# Patient Record
Sex: Female | Born: 1955
Health system: Southern US, Community
[De-identification: ages and names within clinical notes are randomized; demographics above are authoritative.]

## PROBLEM LIST (undated history)

## (undated) DIAGNOSIS — C801 Malignant (primary) neoplasm, unspecified: Secondary | ICD-10-CM

## (undated) HISTORY — PX: BREAST LUMPECTOMY: SHX2

## (undated) HISTORY — PX: CYSTOSCOPY: SUR368

---

## 2020-08-06 ENCOUNTER — Emergency Department: Admit: 2020-08-06 | Discharge status: Absent without leave | Payer: Self-pay

## 2020-08-06 ENCOUNTER — Emergency Department (INDEPENDENT_AMBULATORY_CARE_PROVIDER_SITE_OTHER): Payer: BC Managed Care – PPO

## 2020-08-06 ENCOUNTER — Emergency Department (INDEPENDENT_AMBULATORY_CARE_PROVIDER_SITE_OTHER)
Admission: EM | Admit: 2020-08-06 | Discharge: 2020-08-06 | Disposition: A | Discharge status: Home / Self Care | Payer: BC Managed Care – PPO | Source: Home / Self Care

## 2020-08-06 ENCOUNTER — Other Ambulatory Visit: Payer: Self-pay

## 2020-08-06 DIAGNOSIS — R0781 Pleurodynia: Secondary | ICD-10-CM | POA: Diagnosis not present

## 2020-08-06 DIAGNOSIS — S299XXA Unspecified injury of thorax, initial encounter: Secondary | ICD-10-CM

## 2020-08-06 DIAGNOSIS — W19XXXA Unspecified fall, initial encounter: Secondary | ICD-10-CM

## 2020-08-06 DIAGNOSIS — S2232XA Fracture of one rib, left side, initial encounter for closed fracture: Secondary | ICD-10-CM | POA: Diagnosis not present

## 2020-08-06 HISTORY — DX: Malignant (primary) neoplasm, unspecified: C80.1

## 2020-08-06 MED ORDER — NAPROXEN 375 MG PO TABS: 375.0000 mg | ORAL_TABLET | Freq: Two times a day (BID) | ORAL | 0 refills | Status: AC

## 2020-08-06 NOTE — Discharge Instructions (Signed)
Take naproxen 375 twice daily as needed for pain related to rib fracture.  Ensure that you are performing deep breathing exercises not taking 10 deep breaths at least 5-6 times daily. If symptoms worsen or do not improve I have provided the information to contact Dr. Darene Lamer who is located next door and her primary care office he is our sports medicine provider for the urgent care and can assist if any of your symptoms worsen.

## 2020-08-06 NOTE — ED Provider Notes (Signed)
[facility]    CSN: [id number] Arrival date & time: 08/06/20  1657      History   Chief Complaint Chief Complaint  Patient presents with  . Chest Pain    L ribs    HPI Carolyn Oliver is a 65 y.o. female.   HPI  Patient sustained a fall today in which she landed on her left chest wall.  Since fall has occurred she has had pain with deep breathing and endorses some left-sided rib pain. She denies any overt shortness of breath. She is experiencing pain with extending her left upper extremity and with certain twisting movements.. Patient recently discontinued use of Prolia and has concern for fragile bones since discontinuing medication.  She plans to follow-up with her provider to discuss alternative options.  Endorses high doses of supplements of calcium and vitamin D.  Patient also had a lumpectomy and due to breast cancer involving the left breast and is currently taken Femara. Past Medical History:  Diagnosis Date  . Cancer ([place])    L breast and bladder    There are no problems to display for this patient.    OB History   No obstetric history on file.      Home Medications    Prior to Admission medications   Medication Sig Start Date End Date Taking? Authorizing Provider  letrozole (FEMARA) 2.5 MG tablet Take 2.5 mg by mouth daily.   Yes [provider]  Multiple Vitamins-Minerals (MULTIVITAMIN WITH MINERALS) tablet Take 1 tablet by mouth daily.   Yes [provider]    Family History Family History  Problem Relation Age of Onset  . Breast cancer Mother   . Cancer Father     Social History Social History   Tobacco Use  . Smoking status: Former Research scientist (life sciences)  . Smokeless tobacco: Never Used  . Tobacco comment: Quit 30 years ago  Vaping Use  . Vaping Use: Never used  Substance Use Topics  . Alcohol use: Yes    Alcohol/week: 2.0 standard drinks    Types: 2 Glasses of wine per week  . Drug use: Never     Allergies   Patient has  no known allergies.   Review of Systems Review of Systems Pertinent negatives listed in HPI  Physical Exam Triage Vital Signs ED Triage Vitals  Enc Vitals Group     BP 08/06/20 1725 112/79     Pulse Rate 08/06/20 1725 90     Resp 08/06/20 1725 20     Temp 08/06/20 1725 98.7 F (37.1 C)     Temp Source 08/06/20 1725 Oral     SpO2 08/06/20 1725 97 %     Weight 08/06/20 1718 220 lb (99.8 kg)     Height 08/06/20 1718 5\' 7"  (1.702 m)     Head Circumference --      Peak Flow --      Pain Score --      Pain Loc --      Pain Edu? --      Excl. in [place]? --    No data found.  Updated Vital Signs BP 112/79 (BP Location: Left Arm)   Pulse 90   Temp 98.7 F (37.1 C) (Oral)   Resp 20   Ht 5\' 7"  (1.702 m)   Wt 220 lb (99.8 kg)   SpO2 97%   BMI 34.46 kg/m   Visual Acuity Right Eye Distance:   Left Eye Distance:   Bilateral Distance:  Right Eye Near:   Left Eye Near:    Bilateral Near:     Physical Exam General appearance: alert, well nourished, cooperative Head: Normocephalic, without obvious abnormality, atraumatic Respiratory: Respirations even and unlabored, normal respiratory rate Heart: rate and rhythm normal.  Skin: Skin color, texture, turgor normal. No rashes seen  Psych: Appropriate mood and affect. Neurologic: GCS 15, normal gait, symmetrical movements.  UC Treatments / Results  Labs (all labs ordered are listed, but only abnormal results are displayed) Labs Reviewed - No data to display  EKG   Radiology No results found.  Procedures Procedures (including critical care time)  Medications Ordered in UC Medications - No data to display  Initial Impression / Assessment and Plan / UC Course  I have reviewed the triage vital signs and the nursing notes.  Pertinent labs & imaging results that were available during my care of the patient were reviewed by me and considered in my medical decision making (see chart for details).     Patient presents  today for concern of a possible rib fracture after sustaining a fall in which she landed on her left chest wall and left breast.  Patient is a cancer survivor recently discontinued taking Prolia per imaging patient had an apparent eighth rib fracture which correlates to patient's level of pain.  Patient offered pain medication however declined would like to try NSAIDs as pain is only present with movements.  Encouraged her to perform deep breathing exercises to prevent development of a pneumonia.  Advised patient also to follow-up with Dr. Darene Lamer if any of her symptoms worsen or do not improve however to the patient with healing of fractures involving the ribs as these take additional time compared to other types of simple fractures. Final Clinical Impressions(s) / UC Diagnoses   Final diagnoses:  Closed fracture of one rib of left side, initial encounter 8th rib  Fall, initial encounter     Discharge Instructions     Take naproxen 375 twice daily as needed for pain related to rib fracture.  Ensure that you are performing deep breathing exercises not taking 10 deep breaths at least 5-6 times daily. If symptoms worsen or do not improve I have provided the information to contact Dr. Darene Lamer who is located next door and her primary care office he is our sports medicine provider for the urgent care and can assist if any of your symptoms worsen.    ED Prescriptions    Medication Sig Dispense Auth. Provider   naproxen (NAPROSYN) 375 MG tablet Take 1 tablet (375 mg total) by mouth 2 (two) times daily. 20 tablet Scot Jun, FNP     PDMP not reviewed this encounter.   Scot Jun, FNP 08/06/20 [phone]

## 2020-08-06 NOTE — ED Triage Notes (Signed)
Pt presents to Urgent Care with w/ L rib and breast pain following a fall today. She reports that her foot sank into a hole in her yard this afternoon which caused her to fall and land on her L side. Denies sob, but states it hurts to take a deep breath.

## 2021-11-21 IMAGING — DX DG RIBS W/ CHEST 3+V*L*
3 series · 3 of 3 positions shown · non-contrast
Comparison: None.

CLINICAL DATA: Fell with trauma to the left chest.

EXAM:
LEFT RIBS AND CHEST - 3+ VIEW

[chest pa]
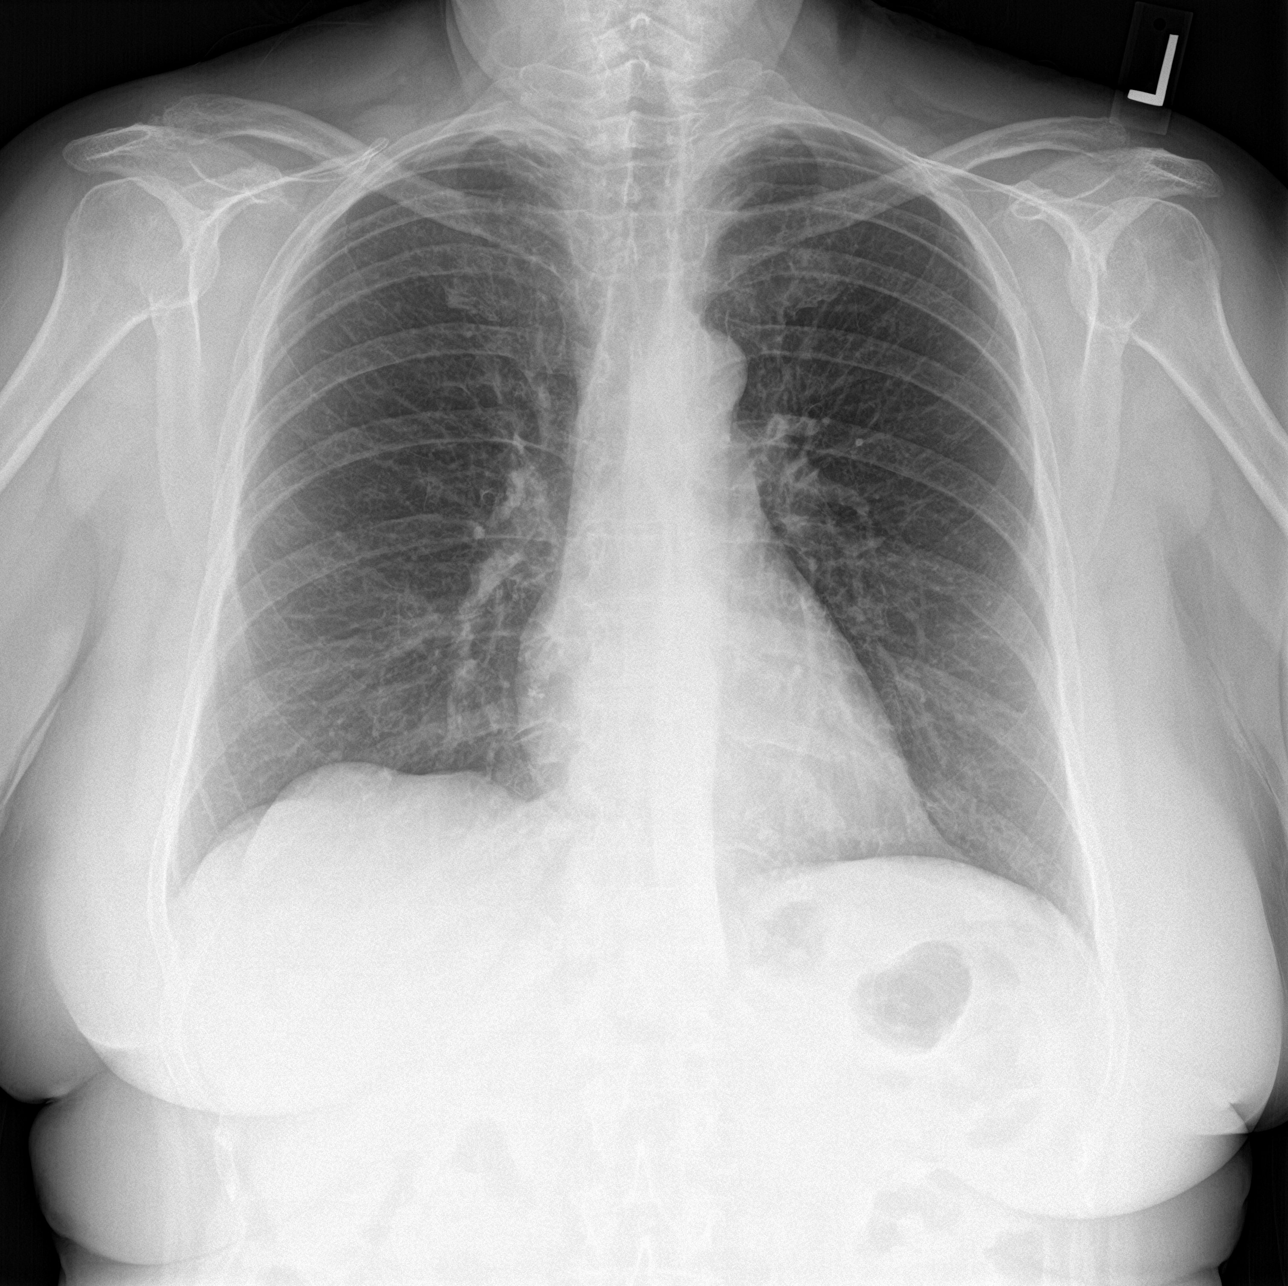

[rib pa]
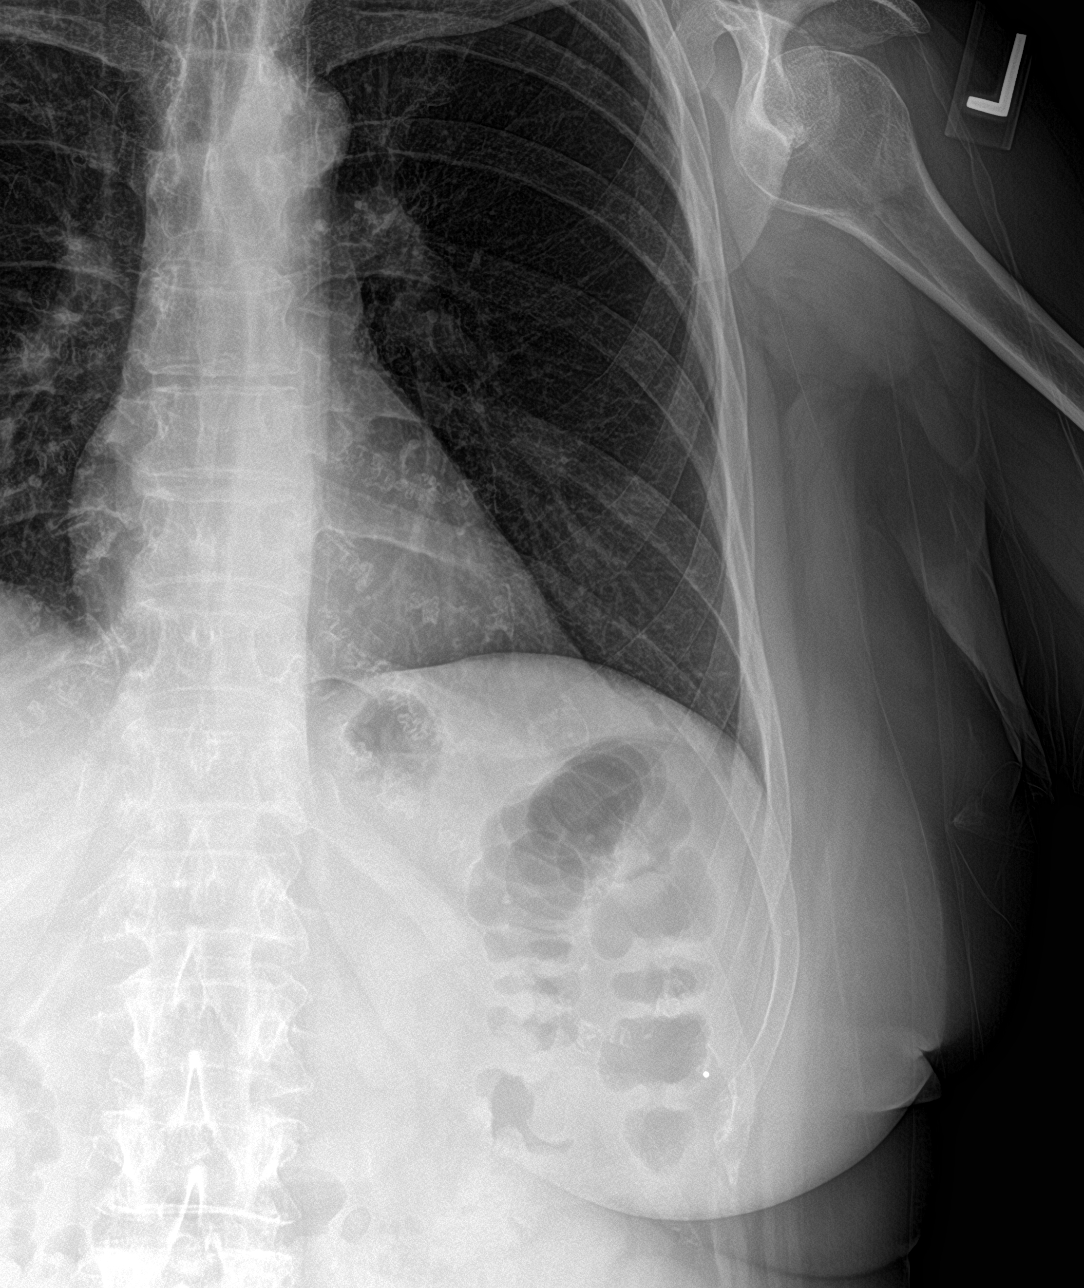

[rib pa obl]
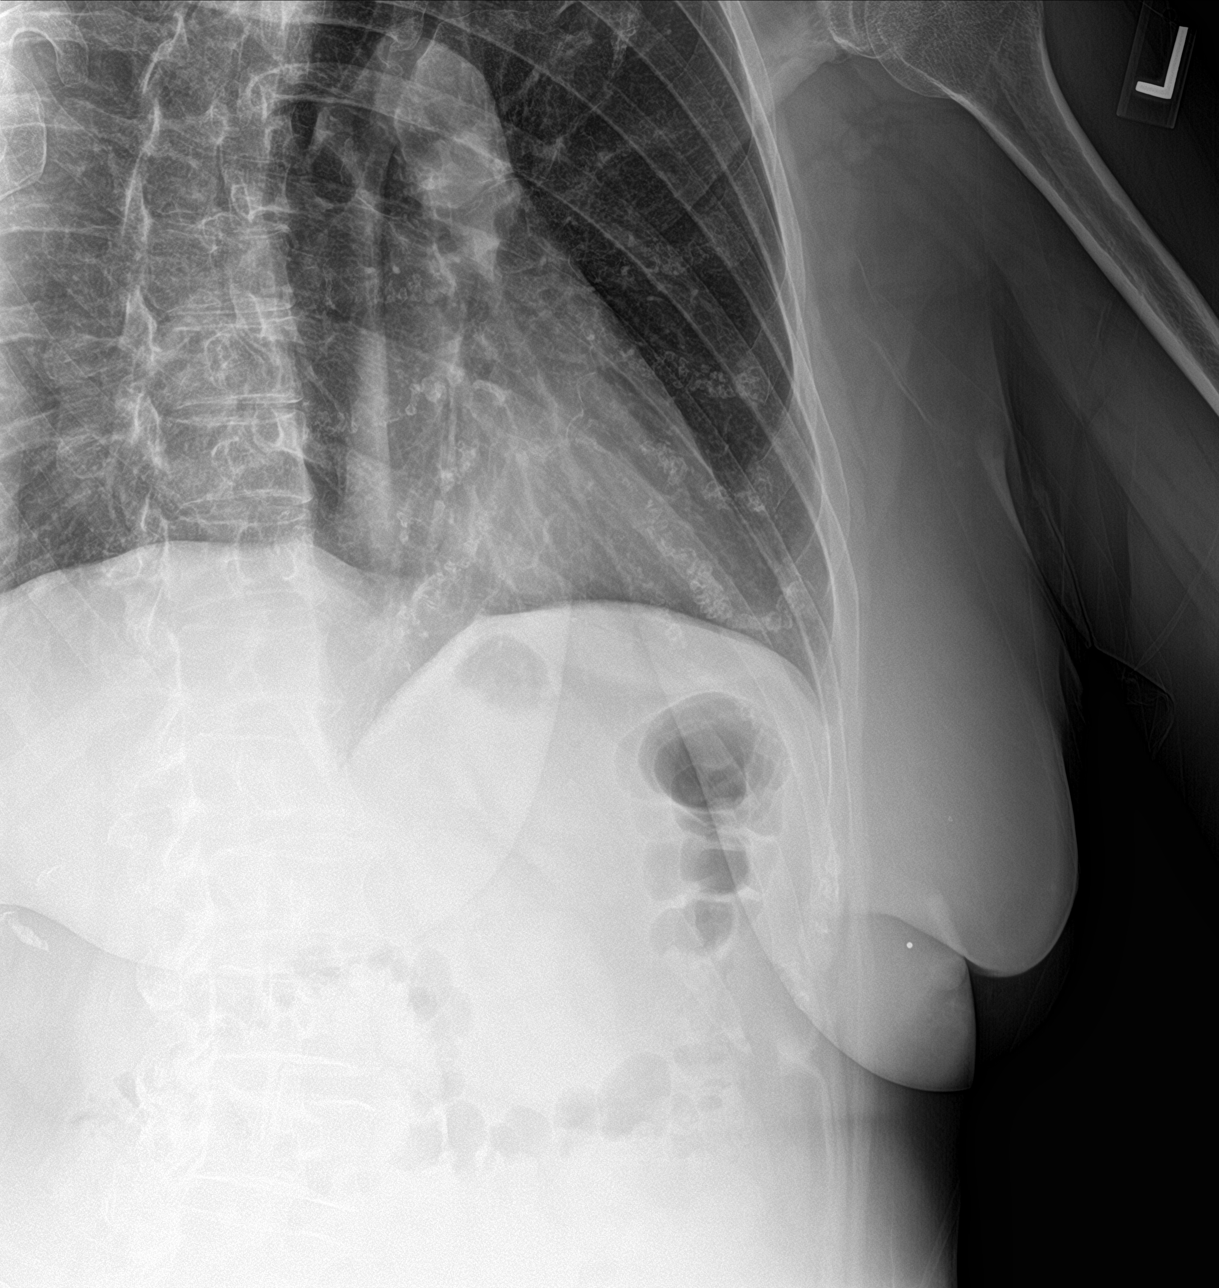

[3 of 3 positions shown; findings below may reference images not displayed]

FINDINGS: Heart and mediastinal shadows are normal. The lungs are clear. No
pneumothorax or hemothorax. Skin marker in the area of pain is
present in the left lower anterior chest. No definite acute rib
fracture. One could question a minimal nondisplaced fracture of the
anterior eighth rib.
IMPRESSION: No active cardiopulmonary disease. No definite acute rib fracture.
One could question a minimal nondisplaced fracture of the anterior
left eighth rib.
